# Patient Record
Sex: Female | Born: 2005 | Race: Black or African American | Hispanic: No | Marital: Single
Health system: Southern US, Community
[De-identification: ages and names within clinical notes are randomized; demographics above are authoritative.]

## PROBLEM LIST (undated history)

## (undated) HISTORY — PX: TONSILLECTOMY: SUR1361

---
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
## 2024-01-07 ENCOUNTER — Ambulatory Visit (HOSPITAL_COMMUNITY)
Admission: EM | Admit: 2024-01-07 | Discharge: 2024-01-08 | Disposition: A | Discharge status: Other Acute Inpatient Hospital

## 2024-01-07 ENCOUNTER — Encounter (HOSPITAL_COMMUNITY): Payer: Self-pay | Admitting: Emergency Medicine

## 2024-01-07 DIAGNOSIS — R45851 Suicidal ideations: Secondary | ICD-10-CM | POA: Diagnosis not present

## 2024-01-07 DIAGNOSIS — Z62810 Personal history of physical and sexual abuse in childhood: Secondary | ICD-10-CM | POA: Diagnosis not present

## 2024-01-07 DIAGNOSIS — M08031 Unspecified juvenile rheumatoid arthritis, right wrist: Secondary | ICD-10-CM | POA: Diagnosis not present

## 2024-01-07 DIAGNOSIS — F332 Major depressive disorder, recurrent severe without psychotic features: Secondary | ICD-10-CM | POA: Diagnosis not present

## 2024-01-07 DIAGNOSIS — Z79620 Long term (current) use of immunosuppressive biologic: Secondary | ICD-10-CM | POA: Insufficient documentation

## 2024-01-07 DIAGNOSIS — M08032 Unspecified juvenile rheumatoid arthritis, left wrist: Secondary | ICD-10-CM | POA: Insufficient documentation

## 2024-01-07 DIAGNOSIS — Z638 Other specified problems related to primary support group: Secondary | ICD-10-CM | POA: Insufficient documentation

## 2024-01-07 LAB — CBC WITH DIFFERENTIAL/PLATELET
Abs Immature Granulocytes: 0.01 K/uL (ref 0.00–0.07)
Basophils Absolute: 0 K/uL (ref 0.0–0.1)
Basophils Relative: 1 %
Eosinophils Absolute: 0.1 K/uL (ref 0.0–0.5)
Eosinophils Relative: 1 %
HCT: 39.4 % (ref 36.0–46.0)
Hemoglobin: 12.6 g/dL (ref 12.0–15.0)
Immature Granulocytes: 0 %
Lymphocytes Relative: 37 %
Lymphs Abs: 1.8 K/uL (ref 0.7–4.0)
MCH: 26.7 pg (ref 26.0–34.0)
MCHC: 32 g/dL (ref 30.0–36.0)
MCV: 83.5 fL (ref 80.0–100.0)
Monocytes Absolute: 0.4 K/uL (ref 0.1–1.0)
Monocytes Relative: 8 %
Neutro Abs: 2.6 K/uL (ref 1.7–7.7)
Neutrophils Relative %: 53 %
Platelets: 305 K/uL (ref 150–400)
RBC: 4.72 MIL/uL (ref 3.87–5.11)
RDW: 14.4 % (ref 11.5–15.5)
WBC: 4.9 K/uL (ref 4.0–10.5)
nRBC: 0 % (ref 0.0–0.2)

## 2024-01-07 LAB — LIPID PANEL
Cholesterol: 189 mg/dL (ref 0–200)
HDL: 60 mg/dL (ref >40)
LDL Cholesterol: 120 mg/dL — ABNORMAL HIGH (ref 0–99)
Total CHOL/HDL Ratio: 3.2 ratio
Triglycerides: 44 mg/dL (ref <150)
VLDL: 9 mg/dL (ref 0–40)

## 2024-01-07 LAB — HEMOGLOBIN A1C
Hgb A1c MFr Bld: 4.9 % (ref 4.8–5.6)
Mean Plasma Glucose: 93.93 mg/dL

## 2024-01-07 LAB — COMPREHENSIVE METABOLIC PANEL WITH GFR
ALT: 29 U/L (ref 0–44)
AST: 36 U/L (ref 15–41)
Albumin: 4.5 g/dL (ref 3.5–5.0)
Alkaline Phosphatase: 42 U/L (ref 38–126)
Anion gap: 15 (ref 5–15)
BUN: 7 mg/dL (ref 6–20)
CO2: 23 mmol/L (ref 22–32)
Calcium: 9.9 mg/dL (ref 8.9–10.3)
Chloride: 99 mmol/L (ref 98–111)
Creatinine, Ser: 0.78 mg/dL (ref 0.44–1.00)
GFR, Estimated: >60 mL/min (ref >60)
Glucose, Bld: 87 mg/dL (ref 70–99)
Potassium: 3.9 mmol/L (ref 3.5–5.1)
Sodium: 137 mmol/L (ref 135–145)
Total Bilirubin: 1.6 mg/dL — ABNORMAL HIGH (ref 0.0–1.2)
Total Protein: 7.5 g/dL (ref 6.5–8.1)

## 2024-01-07 LAB — ETHANOL: Alcohol, Ethyl (B): <15 mg/dL (ref <15)

## 2024-01-07 LAB — TSH: TSH: 1.045 u[IU]/mL (ref 0.350–4.500)

## 2024-01-07 MED ORDER — MAGNESIUM HYDROXIDE 400 MG/5ML PO SUSP: 30.0000 mL | Freq: Every day | ORAL | Status: DC | PRN

## 2024-01-07 MED ORDER — DIPHENHYDRAMINE HCL 50 MG/ML IJ SOLN: 50.0000 mg | Freq: Three times a day (TID) | INTRAMUSCULAR | Status: DC | PRN

## 2024-01-07 MED ORDER — ALUM & MAG HYDROXIDE-SIMETH 200-200-20 MG/5ML PO SUSP: 30.0000 mL | ORAL | Status: DC | PRN

## 2024-01-07 MED ORDER — HYDROXYZINE HCL 25 MG PO TABS: 25.0000 mg | ORAL_TABLET | Freq: Three times a day (TID) | ORAL | Status: DC | PRN

## 2024-01-07 MED ORDER — ACETAMINOPHEN 325 MG PO TABS: 650.0000 mg | ORAL_TABLET | Freq: Four times a day (QID) | ORAL | Status: DC | PRN

## 2024-01-07 NOTE — ED Notes (Signed)
 Pt sleeping at present, no distress noted. Respirations even & unlabored.  Monitoring for safety.
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
## 2024-01-07 NOTE — ED Notes (Signed)
 Pt sad, depressed, flat affect, quiet. Only nods to answer questions. Denies SI/ HI/AVH. Contracts for safety. No noted distress. Will continue to monitor for safety

## 2024-01-07 NOTE — ED Notes (Signed)
 Pt reports si, denies plan and intent, verbal contract for safety provided. Pt presents as depressed, soft spoken and guarded. Pt provided with sandwich as desired. Pt denies desire to harm others. Pt oriented to unit.

## 2024-01-08 ENCOUNTER — Other Ambulatory Visit: Payer: Self-pay

## 2024-01-08 ENCOUNTER — Encounter (HOSPITAL_COMMUNITY): Payer: Self-pay

## 2024-01-08 ENCOUNTER — Inpatient Hospital Stay (HOSPITAL_COMMUNITY)
Admission: AD | Admit: 2024-01-08 | Discharge: 2024-01-11 | DRG: 881 | Disposition: A | Discharge status: Home / Self Care | Source: Intra-hospital

## 2024-01-08 DIAGNOSIS — F329 Major depressive disorder, single episode, unspecified: Secondary | ICD-10-CM | POA: Diagnosis present

## 2024-01-08 DIAGNOSIS — F4322 Adjustment disorder with anxiety: Secondary | ICD-10-CM | POA: Diagnosis present

## 2024-01-08 DIAGNOSIS — Z62810 Personal history of physical and sexual abuse in childhood: Secondary | ICD-10-CM | POA: Diagnosis not present

## 2024-01-08 DIAGNOSIS — Z733 Stress, not elsewhere classified: Secondary | ICD-10-CM

## 2024-01-08 DIAGNOSIS — Z608 Other problems related to social environment: Secondary | ICD-10-CM | POA: Diagnosis present

## 2024-01-08 DIAGNOSIS — Z79631 Long term (current) use of antimetabolite agent: Secondary | ICD-10-CM

## 2024-01-08 DIAGNOSIS — Z560 Unemployment, unspecified: Secondary | ICD-10-CM

## 2024-01-08 DIAGNOSIS — Z79899 Other long term (current) drug therapy: Secondary | ICD-10-CM

## 2024-01-08 DIAGNOSIS — Z818 Family history of other mental and behavioral disorders: Secondary | ICD-10-CM | POA: Diagnosis not present

## 2024-01-08 DIAGNOSIS — R45851 Suicidal ideations: Secondary | ICD-10-CM | POA: Diagnosis present

## 2024-01-08 DIAGNOSIS — Z79620 Long term (current) use of immunosuppressive biologic: Secondary | ICD-10-CM | POA: Diagnosis not present

## 2024-01-08 DIAGNOSIS — M069 Rheumatoid arthritis, unspecified: Secondary | ICD-10-CM | POA: Diagnosis present

## 2024-01-08 LAB — POCT URINE DRUG SCREEN - MANUAL ENTRY (I-SCREEN)
POC Amphetamine UR: NOT DETECTED
POC Buprenorphine (BUP): NOT DETECTED
POC Cocaine UR: NOT DETECTED
POC Marijuana UR: NOT DETECTED
POC Methadone UR: NOT DETECTED
POC Methamphetamine UR: NOT DETECTED
POC Morphine: NOT DETECTED
POC Oxazepam (BZO): NOT DETECTED
POC Oxycodone UR: NOT DETECTED
POC Secobarbital (BAR): NOT DETECTED

## 2024-01-08 MED ORDER — DIPHENHYDRAMINE HCL 25 MG PO CAPS: 50.0000 mg | ORAL_CAPSULE | Freq: Three times a day (TID) | ORAL | Status: DC | PRN

## 2024-01-08 MED ORDER — DIPHENHYDRAMINE HCL 50 MG/ML IJ SOLN: 50.0000 mg | Freq: Three times a day (TID) | INTRAMUSCULAR | Status: DC | PRN

## 2024-01-08 MED ORDER — HALOPERIDOL 5 MG PO TABS: 5.0000 mg | ORAL_TABLET | Freq: Three times a day (TID) | ORAL | Status: DC | PRN

## 2024-01-08 MED ORDER — MAGNESIUM HYDROXIDE 400 MG/5ML PO SUSP: 30.0000 mL | Freq: Every day | ORAL | Status: DC | PRN

## 2024-01-08 MED ORDER — ALUM & MAG HYDROXIDE-SIMETH 200-200-20 MG/5ML PO SUSP: 30.0000 mL | ORAL | Status: DC | PRN

## 2024-01-08 MED ORDER — HYDROXYZINE HCL 25 MG PO TABS: 25.0000 mg | ORAL_TABLET | Freq: Three times a day (TID) | ORAL | Status: DC | PRN

## 2024-01-08 MED ORDER — HALOPERIDOL LACTATE 5 MG/ML IJ SOLN: 10.0000 mg | Freq: Three times a day (TID) | INTRAMUSCULAR | Status: DC | PRN

## 2024-01-08 MED ORDER — ACETAMINOPHEN 325 MG PO TABS: 650.0000 mg | ORAL_TABLET | Freq: Four times a day (QID) | ORAL | Status: DC | PRN

## 2024-01-08 MED ORDER — LORAZEPAM 2 MG/ML IJ SOLN: 2.0000 mg | Freq: Three times a day (TID) | INTRAMUSCULAR | Status: DC | PRN

## 2024-01-08 MED ORDER — ENSURE PLUS HIGH PROTEIN PO LIQD
237.0000 mL | Freq: Two times a day (BID) | ORAL | Status: DC
  Filled 2024-01-08 (×8): qty 237

## 2024-01-08 MED ORDER — HALOPERIDOL LACTATE 5 MG/ML IJ SOLN: 5.0000 mg | Freq: Three times a day (TID) | INTRAMUSCULAR | Status: DC | PRN

## 2024-01-08 MED ORDER — INFLUENZA VIRUS VACC SPLIT PF (FLUZONE) 0.5 ML IM SUSY
0.5000 mL | PREFILLED_SYRINGE | INTRAMUSCULAR | Status: DC
Start: 2024-01-09 — End: 2024-01-11
  Filled 2024-01-08: qty 0.5

## 2024-01-08 NOTE — ED Notes (Signed)
 Pt A&Ox4, calm & cooperative, nods appropriately and in NAD at this time. Denies SI/HI/AVH. Contracts for safety. Encouragement and support given. Will continue to monitor.

## 2024-01-08 NOTE — Tx Team (Signed)
 Initial Treatment Plan 01/08/2024 12:28 PM Christine Lucero FMW:[id number]    PATIENT STRESSORS: Loss of relationship     PATIENT STRENGTHS: Motivation for treatment/growth  Special hobby/interest  Supportive family/friends    PATIENT IDENTIFIED PROBLEMS: I went through a breakup                     DISCHARGE CRITERIA:  Improved stabilization in mood, thinking, and/or behavior  PRELIMINARY DISCHARGE PLAN: Outpatient therapy  PATIENT/FAMILY INVOLVEMENT: This treatment plan has been presented to and reviewed with the patient, Christine Lucero.  The patient has been given the opportunity to ask questions and make suggestions.  Annalee Larch, RN 01/08/2024, 12:28 PM

## 2024-01-08 NOTE — ED Notes (Signed)
 Pt sleeping at this time. Rise and fall of chest noted. Pt in NAD at this time. Will continue to monitor.

## 2024-01-08 NOTE — Group Note (Signed)
 Date:  01/08/2024 Time:  5:02 PM  Group Topic/Focus:  Emotional Education:   The focus of this group is to discuss what feelings/emotions are, and how they are experienced.    Participation Level:  Active  Participation Quality:  Appropriate  Affect:  Appropriate  Cognitive:  Appropriate  Insight: Good  Engagement in Group:  Engaged  Modes of Intervention:  Discussion   Christine Lucero 01/08/2024, 5:02 PM
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
## 2024-01-08 NOTE — Plan of Care (Signed)
  Problem: Education: Goal: Emotional status will improve Outcome: Progressing Goal: Verbalization of understanding the information provided will improve Outcome: Progressing   Problem: Activity: Goal: Interest or engagement in activities will improve Outcome: Progressing   Problem: Coping: Goal: Ability to demonstrate self-control will improve Outcome: Progressing

## 2024-01-08 NOTE — ED Notes (Signed)
 Pt in bed w/eyes closed resting quietly. Respirations equal and unlabored. No signs or symptoms of distress. Routine safety checks maintained/ongoing.

## 2024-01-08 NOTE — [facility] Group Notes (Signed)
 [facility] Group Notes:  (Nursing/MHT/Case Management/Adjunct)  Date:  01/08/2024  Time:  9:08 PM  Type of Therapy:  Psychoeducational Skills  Participation Level:  Active  Participation Quality:  Attentive  Affect:  Flat  Cognitive:  Appropriate  Insight:  Appropriate  Engagement in Group:  Developing/Improving  Modes of Intervention:  Education  Summary of Progress/Problems: Patient rated her day as a 7 out of a possible 10. She was active in playing basketball with her peers and states that her positive event for the day was talking to her peers.   Donyae Kohn S 01/08/2024, 9:08 PM

## 2024-01-08 NOTE — ED Notes (Addendum)
 Pt signed electronic consent.  [facility] notified. Urine cup at bedside, pending urine.

## 2024-01-08 NOTE — Progress Notes (Signed)
   01/08/24 2200  Psych Admission Type (Psych Patients Only)  Admission Status Voluntary  Psychosocial Assessment  Patient Complaints Depression  Eye Contact Fair  Facial Expression Flat;Sad  Affect Depressed  Speech Logical/coherent;Soft  Interaction Cautious  Motor Activity Slow  Appearance/Hygiene Unremarkable  Behavior Characteristics Cooperative;Appropriate to situation  Mood Depressed  Thought Process  Coherency WDL  Content WDL  Delusions None reported or observed  Perception WDL  Hallucination None reported or observed  Judgment WDL  Confusion None  Danger to Self  Current suicidal ideation? Denies  Agreement Not to Harm Self Yes  Description of Agreement Verbal  Danger to Others  Danger to Others None reported or observed

## 2024-01-08 NOTE — Progress Notes (Signed)
   01/08/24 1200  Psych Admission Type (Psych Patients Only)  Admission Status Voluntary  Psychosocial Assessment  Patient Complaints Depression  Eye Contact Fair  Facial Expression Blank;Flat;Sad  Affect Depressed  Speech Soft;Logical/coherent  Interaction Cautious;Minimal  Motor Activity Slow  Appearance/Hygiene Unremarkable  Behavior Characteristics Cooperative;Appropriate to situation  Mood Depressed;Sad  Thought Process  Coherency WDL  Content WDL  Delusions None reported or observed  Perception WDL  Hallucination None reported or observed  Judgment WDL  Confusion None  Danger to Self  Current suicidal ideation? Denies  Agreement Not to Harm Self Yes  Description of Agreement verbal  Danger to Others  Danger to Others None reported or observed

## 2024-01-08 NOTE — Discharge Instructions (Addendum)
To [facility]

## 2024-01-08 NOTE — ED Notes (Signed)
 Patient Vol transferred to [facility] via safe transport. Pt A&Ox4, ambulatory w/ steady gait and VSS upon departure. All belongings and paperwork given to safe transport.

## 2024-01-08 NOTE — Group Note (Signed)
 LCSW Group Therapy Note   Group Date: 01/08/2024 Start Time: 1100 End Time: 1200   Participation:  did not attend  Type of Therapy:  Group Therapy  Topic:  Healing From Within: Understanding Our Past, Building Our Future"  Objective:  To help participants understand the impact of early experiences on mental and physical health, with a focus on Adverse Childhood Experiences (ACEs), and to explore ways to build resilience and healing.  Goals: Understand ACEs and Their Impact: Learn how childhood experiences shape mental and physical health. Build Resilience: Develop strategies for overcoming challenges and creating positive change. Promote Healing: Recognize the value of support and the possibility of healing through therapy and self-care.  Summary:  In today's session, we discussed how early experiences, especially ACEs, impact mental and physical health. We explored the effects of stress, abuse, and neglect on brain development and well-being. The group focused on resilience, understanding that healing and positive change are possible with support and self-awareness.  Therapeutic Modalities Used: Psychoeducation: Sharing information about ACEs and their effects. Cognitive Behavioral Therapy (CBT): Helping reframe negative thought patterns. Trauma-Informed Therapy: Creating a safe, supportive space for healing.   Kellina Dreese O Amalio Loe, LCSWA 01/08/2024  12:24 PM
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
## 2024-01-09 ENCOUNTER — Encounter (HOSPITAL_COMMUNITY): Payer: Self-pay

## 2024-01-09 MED ORDER — METHOTREXATE SODIUM 2.5 MG PO TABS
10.0000 mg | ORAL_TABLET | ORAL | Status: DC
  Administered 2024-01-10: 10 mg via ORAL
  Filled 2024-01-09: qty 4

## 2024-01-09 MED ORDER — ADALIMUMAB 40 MG/0.4ML ~~LOC~~ AJKT
40.0000 mg | AUTO-INJECTOR | SUBCUTANEOUS | Status: DC
  Administered 2024-01-09: 40 mg via SUBCUTANEOUS

## 2024-01-09 MED ORDER — NON FORMULARY: 40.0000 mg | Status: DC

## 2024-01-09 MED ORDER — FOLIC ACID 1 MG PO TABS
1.0000 mg | ORAL_TABLET | Freq: Every day | ORAL | Status: DC
  Administered 2024-01-09 – 2024-01-11 (×3): 1 mg via ORAL
  Filled 2024-01-09 (×3): qty 1
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
## 2024-01-09 NOTE — Progress Notes (Signed)
 Spirituality group facilitated by Elia Rockie Sofia, [facility].  Group Description: Group focused on topic of hope. Patients participated in facilitated discussion around topic, connecting with one another around experiences and definitions for hope. Group members engaged with visual explorer photos, reflecting on what hope looks like for them today. Group engaged in discussion around how their definitions of hope are present today in hospital.  Modalities: Psycho-social ed, Adlerian, Narrative, MI  Patient Progress: Christine Lucero attended group and actively engaged and participated ing roup conversation and activities.  She finds hope in her mom and her teammates and coaches.

## 2024-01-09 NOTE — BH IP Treatment Plan (Signed)
 Interdisciplinary Treatment and Diagnostic Plan Update  01/09/2024 Time of Session: 10:35 AM Eryca Hightower MRN: [id number]  Principal Diagnosis: MDD (major depressive disorder)  Secondary Diagnoses: Principal Problem:   MDD (major depressive disorder)   Current Medications:  Current Facility-Administered Medications  Medication Dose Route Frequency Provider Last Rate Last Admin   acetaminophen  (TYLENOL ) tablet 650 mg  650 mg Oral Q6H PRN Miller-Almeida, Rock PARAS, NP       adalimumab  (HUMIRA ) pen 40 mg  40 mg Subcutaneous Q14 Days Bouchard, Marc A, DO       alum & mag hydroxide-simeth (MAALOX/MYLANTA) 200-200-20 MG/5ML suspension 30 mL  30 mL Oral Q4H PRN Delsie Lynwood Morene Lavone, MD       haloperidol  (HALDOL ) tablet 5 mg  5 mg Oral TID PRN Delsie Lynwood Morene Lavone, MD       And   diphenhydrAMINE  (BENADRYL ) capsule 50 mg  50 mg Oral TID PRN Delsie Lynwood Morene Lavone, MD       hydrOXYzine  (ATARAX ) tablet 25 mg  25 mg Oral TID PRN Miller-Almeida, Linda J, NP       Or   diphenhydrAMINE  (BENADRYL ) injection 50 mg  50 mg Intramuscular TID PRN Miller-Almeida, Linda J, NP       haloperidol  lactate (HALDOL ) injection 5 mg  5 mg Intramuscular TID PRN Delsie Lynwood Morene Lavone, MD       And   diphenhydrAMINE  (BENADRYL ) injection 50 mg  50 mg Intramuscular TID PRN Delsie Lynwood Morene Lavone, MD       And   LORazepam  (ATIVAN ) injection 2 mg  2 mg Intramuscular TID PRN Delsie Lynwood Morene Lavone, MD       haloperidol  lactate (HALDOL ) injection 10 mg  10 mg Intramuscular TID PRN Delsie Lynwood Morene Lavone, MD       And   diphenhydrAMINE  (BENADRYL ) injection 50 mg  50 mg Intramuscular TID PRN Delsie Lynwood Morene Lavone, MD       And   LORazepam  (ATIVAN ) injection 2 mg  2 mg Intramuscular TID PRN Delsie Lynwood Morene Lavone, MD       feeding supplement (ENSURE PLUS HIGH PROTEIN) liquid 237 mL  237 mL Oral BID BM Bouchard, Marc A, DO       folic acid   (FOLVITE ) tablet 1 mg  1 mg Oral Daily Bennett, Christal H, NP   1 mg at 01/09/24 1529   influenza vac split trivalent PF (FLUZONE ) injection 0.5 mL  0.5 mL Intramuscular Tomorrow-1000 Bouchard, Marc A, DO       magnesium  hydroxide (MILK OF MAGNESIA) suspension 30 mL  30 mL Oral Daily PRN Delsie Lynwood Morene Lavone, MD       [START ON 01/10/2024] methotrexate  (RHEUMATREX) tablet 10 mg  10 mg Oral Q Thu Bennett, Christal H, NP       PTA Medications: Medications Prior to Admission  Medication Sig Dispense Refill Last Dose/Taking   adalimumab  (HUMIRA , 2 PEN,) 40 MG/0.4ML pen Inject 40 mg into the skin every 14 (fourteen) days.      folic acid  (FOLVITE ) 1 MG tablet Take 1 mg by mouth daily.      methotrexate  (RHEUMATREX) 2.5 MG tablet Take 10 mg by mouth every Thursday.       Patient Stressors: Loss of relationship    Patient Strengths: Motivation for treatment/growth  Special hobby/interest  Supportive family/friends   Treatment Modalities: Medication Management, Group therapy, Case management,  1 to 1 session with clinician, Psychoeducation, Recreational therapy.   Physician Treatment  Plan for Primary Diagnosis: MDD (major depressive disorder) Long Term Goal(s): Improvement in symptoms so as ready for discharge   Short Term Goals: Ability to identify changes in lifestyle to reduce recurrence of condition will improve  Medication Management: Evaluate patient's response, side effects, and tolerance of medication regimen.  Therapeutic Interventions: 1 to 1 sessions, Unit Group sessions and Medication administration.  Evaluation of Outcomes: Not Progressing  Physician Treatment Plan for Secondary Diagnosis: Principal Problem:   MDD (major depressive disorder)  Long Term Goal(s): Improvement in symptoms so as ready for discharge   Short Term Goals: Ability to identify changes in lifestyle to reduce recurrence of condition will improve     Medication Management: Evaluate  patient's response, side effects, and tolerance of medication regimen.  Therapeutic Interventions: 1 to 1 sessions, Unit Group sessions and Medication administration.  Evaluation of Outcomes: Not Progressing   RN Treatment Plan for Primary Diagnosis: MDD (major depressive disorder) Long Term Goal(s): Knowledge of disease and therapeutic regimen to maintain health will improve  Short Term Goals: Ability to remain free from injury will improve, Ability to verbalize frustration and anger appropriately will improve, Ability to demonstrate self-control, Ability to participate in decision making will improve, Ability to verbalize feelings will improve, Ability to disclose and discuss suicidal ideas, Ability to identify and develop effective coping behaviors will improve, and Compliance with prescribed medications will improve  Medication Management: RN will administer medications as ordered by provider, will assess and evaluate patient's response and provide education to patient for prescribed medication. RN will report any adverse and/or side effects to prescribing provider.  Therapeutic Interventions: 1 on 1 counseling sessions, Psychoeducation, Medication administration, Evaluate responses to treatment, Monitor vital signs and CBGs as ordered, Perform/monitor CIWA, COWS, AIMS and Fall Risk screenings as ordered, Perform wound care treatments as ordered.  Evaluation of Outcomes: Not Progressing   LCSW Treatment Plan for Primary Diagnosis: MDD (major depressive disorder) Long Term Goal(s): Safe transition to appropriate next level of care at discharge, Engage patient in therapeutic group addressing interpersonal concerns.  Short Term Goals: Engage patient in aftercare planning with referrals and resources, Increase social support, Increase ability to appropriately verbalize feelings, Increase emotional regulation, Facilitate acceptance of mental health diagnosis and concerns, Facilitate patient  progression through stages of change regarding substance use diagnoses and concerns, Identify triggers associated with mental health/substance abuse issues, and Increase skills for wellness and recovery  Therapeutic Interventions: Assess for all discharge needs, 1 to 1 time with Social worker, Explore available resources and support systems, Assess for adequacy in community support network, Educate family and significant other(s) on suicide prevention, Complete Psychosocial Assessment, Interpersonal group therapy.  Evaluation of Outcomes: Not Progressing   Progress in Treatment: Attending groups: Yes. Participating in groups: Yes. Taking medication as prescribed: Yes. Toleration medication: Yes. Family/Significant other contact made: No, will contact:  Lela (mother) [phone] Patient understands diagnosis: Yes. Discussing patient identified problems/goals with staff: Yes. Medical problems stabilized or resolved: Yes. Denies suicidal/homicidal ideation: Yes. Issues/concerns per patient self-inventory: No.  New problem(s) identified:  No  New Short Term/Long Term Goal(s):  Patient recently admitted. CSW will continue to follow and assess for appropriate referrals and possible discharge planning.    Patient Goals:  I'm not sure.    Discharge Plan or Barriers:  Patient recently admitted. CSW will continue to follow and assess for appropriate referrals and possible discharge planning.     Reason for Continuation of Hospitalization: Depression Medication stabilization Suicidal ideation  Estimated Length  of Stay:  5 - 7 days  Last 3 [place] Suicide Severity Risk Score: Flowsheet Row Ad[facility] (Current) [facility] from 01/07/2024 in [facility] ED from 12/12/2020 in [facility] Emergency Department [facility]  C-SSRS RISK CATEGORY No Risk No Risk No Risk    Last [facility] 2/9 Scores:     01/07/2024    4:57 PM  Depression screen PHQ 2/9  Decreased Interest 1  Down, Depressed, Hopeless 1  PHQ - 2 Score 2  Altered sleeping 1  Tired, decreased energy 1  Change in appetite 3  Feeling bad or failure about yourself  2  Trouble concentrating 2  Moving slowly or fidgety/restless 0  Suicidal thoughts 1  PHQ-9 Score 12  Difficult doing work/chores Somewhat difficult    Scribe for Treatment Team: Dierra Riesgo O Maleeka Sabatino, LCSWA 01/09/2024 4:49 PM

## 2024-01-09 NOTE — Progress Notes (Signed)
   01/09/24 0900  Psych Admission Type (Psych Patients Only)  Admission Status Voluntary  Psychosocial Assessment  Patient Complaints Depression  Eye Contact Fair  Facial Expression Flat;Sad  Affect Depressed  Speech Logical/coherent;Soft  Interaction Cautious  Motor Activity Slow  Appearance/Hygiene Unremarkable  Behavior Characteristics Cooperative;Appropriate to situation  Mood Depressed  Thought Process  Coherency WDL  Content WDL  Delusions None reported or observed  Perception WDL  Hallucination None reported or observed  Judgment WDL  Confusion None  Danger to Self  Current suicidal ideation? Denies  Agreement Not to Harm Self Yes  Description of Agreement verbal  Danger to Others  Danger to Others None reported or observed

## 2024-01-09 NOTE — Group Note (Signed)
 Date:  01/09/2024 Time:  11:54 AM  Group Type: Grief Processing Group Modality: Psychoeducational Duration: 1100-1125 Participants: 11 S - Subjective:  Participants expressed a range of emotions related to grief, including sadness, confusion, anger, and moments of acceptance. Several group members shared relief in realizing that their emotional fluctuations are normal and valid. O - Objective: Introduced the Doctor, hospital to normalize the non-linear nature of grief A - Assessment:  Clients demonstrated increased insight into their grief experiences and emotional responses. Participation was active and reflective.  P - Plan: Reinforce normalization of grief variability and encourage ongoing peer sharing   Participation Level:  Active  Participation Quality:  Appropriate  Affect:  Appropriate  Cognitive:  Appropriate  Insight: Appropriate and Limited  Engagement in Group:  Engaged  Modes of Intervention:  Discussion  Additional Comments:  None   Armetta Henri N Vyom Brass 01/09/2024, 11:54 AM

## 2024-01-09 NOTE — BHH Group Notes (Signed)
 Adult Psychoeducational Group Note  Date:  01/09/2024 Time:  8:32 PM  Group Topic/Focus:  Wrap-Up Group:   The focus of this group is to help patients review their daily goal of treatment and discuss progress on daily workbooks.  Participation Level:  Active  Participation Quality:  Appropriate  Affect:  Appropriate  Cognitive:  Appropriate  Insight: Appropriate  Engagement in Group:  Engaged  Modes of Intervention:  Discussion  Additional Comments:  Christine Lucero Attend AA wrap up group.  Lang Drilling Long 01/09/2024, 8:32 PM

## 2024-01-09 NOTE — Group Note (Signed)
 Date:  01/09/2024 Time:  9:40 AM  Group Topic/Focus:  Goals Group:   The focus of this group is to help patients establish daily goals to achieve during treatment and discuss how the patient can incorporate goal setting into their daily lives to aide in recovery.    Participation Level:  Active  Participation Quality:  Appropriate  Affect:  Appropriate  Cognitive:  Appropriate  Insight: Appropriate  Engagement in Group:  Engaged  Modes of Intervention:  Discussion  Additional Comments:  take part in more groups,stay positive,and get upset  Nat Rummer 01/09/2024, 9:40 AM

## 2024-01-09 NOTE — Progress Notes (Signed)
 Methotrexate  (Trexall ; Rheumatrex) hold criteria Hgb < 8 WBC < 3 Pltc < 100K SCr > 1.5x baseline (or > 2 if baseline unknown) AST or ALT >3x ULN Bili > 1.5x ULN Ascites or pleural effusion Diarrhea - Grade 2 or higher Ulcerative stomatitis Unexplained pneumonitis / hypoxemia Active infection   Hold criteria NOT met. Thanks! Landy Almarie SAUNDERS 01/09/2024 3:04 PM

## 2024-01-09 NOTE — Group Note (Signed)
 Date:  01/09/2024 Time:  6:16 PM  Group Topic/Focus:  Developing a Wellness Toolbox:   The focus of this group is to help patients develop a wellness toolbox with skills and strategies to promote recovery upon discharge.    Participation Level:  Active  Participation Quality:  Appropriate  Affect:  Appropriate  Cognitive:  Appropriate  Insight: Appropriate  Engagement in Group:  Engaged  Modes of Intervention:  Discussion   Annalee  Lynzee Lindquist 01/09/2024, 6:16 PM

## 2024-01-09 NOTE — Plan of Care (Signed)
  Problem: Education: Goal: Emotional status will improve Outcome: Progressing Goal: Verbalization of understanding the information provided will improve Outcome: Progressing   Problem: Activity: Goal: Sleeping patterns will improve Outcome: Progressing   Problem: Coping: Goal: Ability to demonstrate self-control will improve Outcome: Progressing

## 2024-01-09 NOTE — BHH Counselor (Signed)
 CSW attempted to complete PSA, however patient was engaged in group at the time. CSW to attempt later.  Jayliana Valencia, LCSWA 01/09/24 10:00am

## 2024-01-09 NOTE — Plan of Care (Signed)
  Problem: Education: Goal: Emotional status will improve Outcome: Progressing Goal: Mental status will improve Outcome: Progressing   Problem: Activity: Goal: Interest or engagement in activities will improve Outcome: Not Progressing   

## 2024-01-09 NOTE — Group Note (Unsigned)
 Date:  01/09/2024 Time:  9:50 AM  Group Topic/Focus:  Goals Group:   The focus of this group is to help patients establish daily goals to achieve during treatment and discuss how the patient can incorporate goal setting into their daily lives to aide in recovery.     Participation Level:  {BHH PARTICIPATION OZCZO:77735}  Participation Quality:  {B[facility] PARTICIPATION QUALITY:22265}  Affect:  {BHH AFFECT:22266}  Cognitive:  {BHH COGNITIVE:22267}  Insight: {BHH Insight2:20797}  Engagement in Group:  {BHH ENGAGEMENT IN HMNLE:77731}  Modes of Intervention:  {BHH MODES OF INTERVENTION:22269}  Additional Comments:  ***  Nat Rummer 01/09/2024, 9:50 AM
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
## 2024-01-09 NOTE — Progress Notes (Signed)
(  Sleep Hours) - 6.5  (Any PRNs that were needed, meds refused, or side effects to meds)- n/a  (Any disturbances and when (visitation, over night)- n/a  (Concerns raised by the patient)- n/a  (SI/HI/AVH)- denies

## 2024-01-09 NOTE — Group Note (Signed)
 Recreation Therapy Group Note   Group Topic:Leisure Education  Group Date: 01/09/2024 Start Time: 0932 End Time: 1000 Facilitators: Mazi Brailsford-McCall, LRT,CTRS Location: [address] Dayroom   Group Topic: Leisure Education  Goal Area(s) Addresses:  Patient will identify positive leisure activities for use post discharge. Patient will identify at least one positive benefit of participation in leisure activities.  Patient will work effectively work with peers to keep the ball in play.  Behavioral Response: Engaged   Intervention: [employer], Music   Activity: Patients were to sit in a circle. Patients would toss a beach ball to each other keeping the ball in motion. LRT would time the group to see how long they could keep the ball moving. Patients could bounce or roll the ball but it could not come to a stop. If the ball were to stop moving, LRT would start the timer over.  Education:  Leisure Scientist, physiological, Special educational needs teacher, Teamwork, Discharge Planning  Education Outcome: Acknowledges education/In group clarification offered/Needs additional education.    Affect/Mood: Appropriate   Participation Level: Engaged   Participation Quality: Independent   Behavior: Appropriate   Speech/Thought Process: Barely audible    Insight: Good   Judgement: Good   Modes of Intervention: Cooperative Play   Patient Response to Interventions:  Engaged   Education Outcome:  In group clarification offered    Clinical Observations/Individualized Feedback: Pt was engaged but soft spoken. Pt appeared to have a good time and was focused on keeping the ball in play.     Plan: Continue to engage patient in RT group sessions 2-3x/week.   Robt Okuda-McCall, LRT,CTRS 01/09/2024 12:29 PM

## 2024-01-10 NOTE — [facility] Suicide Risk Assessment (Signed)
 [facility] INPATIENT:  Family/Significant Other Suicide Prevention Education  Suicide Prevention Education:  Education Completed; Lela (mother) [phone],  (name of family member/significant other) has been identified by the patient as the family member/significant other with whom the patient will be residing, and identified as the person(s) who will aid the patient in the event of a mental health crisis (suicidal ideations/suicide attempt).  With written consent from the patient, the family member/significant other has been provided the following suicide prevention education, prior to the and/or following the discharge of the patient.  Lela (mother) confirmed patient will not have access to firearms/guns/weapons to harm herself or others. Lela reported she'd be willing to assist patient should she have a mental health crisis and would be willing to assist patient with safely storing and securing medications should she need assistance. Lela denied having any safety concerns related to patient discharging home. Patient's mother reported she'll be picking patient up at 11:30am on 01/11/24.   The suicide prevention education provided includes the following: Suicide risk factors Suicide prevention and interventions National Suicide Hotline telephone number [facility] assessment telephone number [facility] Emergency Assistance 911 [facility] and/or Residential Mobile Crisis Unit telephone number  Request made of family/significant other to: Remove weapons (e.g., guns, rifles, knives), all items previously/currently identified as safety concern.   Remove drugs/medications (over-the-counter, prescriptions, illicit drugs), all items previously/currently identified as a safety concern.  The family member/significant other verbalizes understanding of the suicide prevention education information provided.  The family member/significant other agrees to remove the items of safety  concern listed above.  Connelly Spruell M Tristy Udovich, LCSWA 01/10/2024, 3:23 PM

## 2024-01-10 NOTE — Progress Notes (Signed)
 [facility] Inpatient Psychiatry Progress Note  Date: 01/10/24 Patient: Christine Lucero MRN: [id number]  Assessment and Plan: Christine Lucero is a 18 y.o. female admitted for depression and passive suicidal thoughts in the context of a breakup.   Since admission she has improved considerably and is requesting discharge tomorrow.   # MDD (major depressive disorder) - Improved - No medications    Risk Assessment - Low  Discharge Planning Estimated length of stay: DC tomorrow Predicted Discharge location: Home     Interval History and update: Chart reviewed. No significant interval events. Patient has been participating in groups and has been visible in the milieu. She is not prescribed any psychotropics. She slept well. She reports an improved and euthymic mood today. She denies SI. She reports that the admission has been helpful as a sort of respite and break. She feels comfortable returning home tomorrow with plans to restart outpatient therapy.       Physical Exam MSK/Neuro - Normal gait and station Mental Status Exam Appearance - Casually dressed, good hygiene and grooming Attitude - reserved, polite Speech - normal volume, prosody, inflection Mood - Fine Affect - Mildly restricted Thought Process - LLGD Thought Content - No delusional TC expressed SI/HI - Denies Perceptions - Denies; not RIS Judgement/Insight - Good Fund of knowledge - WNL Language - No impairments      Lab Results:  No visits with results within 1 Day(s) from this visit.  Latest known visit with results is:  Admission on 01/07/2024, Discharged on 01/08/2024  Component Date Value Ref Range Status   Sodium 01/07/2024 137  135 - 145 mmol/L Final   Potassium 01/07/2024 3.9  3.5 - 5.1 mmol/L Final   Chloride 01/07/2024 99  98 - 111 mmol/L Final   CO2 01/07/2024 23  22 - 32 mmol/L Final   Glucose, Bld 01/07/2024 87  70 - 99 mg/dL Final   BUN 90/84/7974 7  6 - 20 mg/dL Final    Creatinine, Ser 01/07/2024 0.78  0.44 - 1.00 mg/dL Final   Calcium 90/84/7974 9.9  8.9 - 10.3 mg/dL Final   Total Protein 90/84/7974 7.5  6.5 - 8.1 g/dL Final   Albumin 90/84/7974 4.5  3.5 - 5.0 g/dL Final   AST 90/84/7974 36  15 - 41 U/L Final   ALT 01/07/2024 29  0 - 44 U/L Final   Alkaline Phosphatase 01/07/2024 42  38 - 126 U/L Final   Total Bilirubin 01/07/2024 1.6 (H)  0.0 - 1.2 mg/dL Final   GFR, Estimated 01/07/2024 >60  >60 mL/min Final   Anion gap 01/07/2024 15  5 - 15 Final   WBC 01/07/2024 4.9  4.0 - 10.5 K/uL Final   RBC 01/07/2024 4.72  3.87 - 5.11 MIL/uL Final   Hemoglobin 01/07/2024 12.6  12.0 - 15.0 g/dL Final   HCT 90/84/7974 39.4  36.0 - 46.0 % Final   MCV 01/07/2024 83.5  80.0 - 100.0 fL Final   MCH 01/07/2024 26.7  26.0 - 34.0 pg Final   MCHC 01/07/2024 32.0  30.0 - 36.0 g/dL Final   RDW 90/84/7974 14.4  11.5 - 15.5 % Final   Platelets 01/07/2024 305  150 - 400 K/uL Final   nRBC 01/07/2024 0.0  0.0 - 0.2 % Final   Neutrophils Relative % 01/07/2024 53  % Final   Neutro Abs 01/07/2024 2.6  1.7 - 7.7 K/uL Final   Lymphocytes Relative 01/07/2024 37  % Final   Lymphs Abs 01/07/2024 1.8  0.7 - 4.0 K/uL Final   Monocytes Relative 01/07/2024 8  % Final   Monocytes Absolute 01/07/2024 0.4  0.1 - 1.0 K/uL Final   Eosinophils Relative 01/07/2024 1  % Final   Eosinophils Absolute 01/07/2024 0.1  0.0 - 0.5 K/uL Final   Basophils Relative 01/07/2024 1  % Final   Basophils Absolute 01/07/2024 0.0  0.0 - 0.1 K/uL Final   Immature Granulocytes 01/07/2024 0  % Final   Abs Immature Granulocytes 01/07/2024 0.01  0.00 - 0.07 K/uL Final   Hgb A1c MFr Bld 01/07/2024 4.9  4.8 - 5.6 % Final   Mean Plasma Glucose 01/07/2024 93.93  mg/dL Final   TSH 90/84/7974 1.045  0.350 - 4.500 uIU/mL Final   Alcohol, Ethyl (B) 01/07/2024 <15  <15 mg/dL Final   Cholesterol 90/84/7974 189  0 - 200 mg/dL Final   Triglycerides 90/84/7974 44  <150 mg/dL Final   HDL 90/84/7974 60  >40 mg/dL Final    Total CHOL/HDL Ratio 01/07/2024 3.2  RATIO Final   VLDL 01/07/2024 9  0 - 40 mg/dL Final   LDL Cholesterol 01/07/2024 120 (H)  0 - 99 mg/dL Final   POC Amphetamine UR 01/08/2024 None Detected  NONE DETECTED (Cut Off Level 1000 ng/mL) Corrected   POC Secobarbital (BAR) 01/08/2024 None Detected  NONE DETECTED (Cut Off Level 300 ng/mL) Corrected   POC Buprenorphine (BUP) 01/08/2024 None Detected  NONE DETECTED (Cut Off Level 10 ng/mL) Corrected   POC Oxazepam (BZO) 01/08/2024 None Detected  NONE DETECTED (Cut Off Level 300 ng/mL) Corrected   POC Cocaine UR 01/08/2024 None Detected  NONE DETECTED (Cut Off Level 300 ng/mL) Corrected   POC Methamphetamine UR 01/08/2024 None Detected  NONE DETECTED (Cut Off Level 1000 ng/mL) Corrected   POC Morphine 01/08/2024 None Detected  NONE DETECTED (Cut Off Level 300 ng/mL) Corrected   POC Methadone UR 01/08/2024 None Detected  NONE DETECTED (Cut Off Level 300 ng/mL) Corrected   POC Oxycodone UR 01/08/2024 None Detected  NONE DETECTED (Cut Off Level 100 ng/mL) Corrected   POC Marijuana UR 01/08/2024 None Detected  NONE DETECTED (Cut Off Level 50 ng/mL) Corrected     Vitals: Blood pressure 107/70, pulse 68, temperature 98.4 F (36.9 C), temperature source Oral, resp. rate 16, height 5' 1 (1.549 m), weight 52.7 kg, SpO2 100%.    Oliva DELENA Salmon, DO

## 2024-01-10 NOTE — Group Note (Deleted)
 Date:  01/10/2024 Time:  8:41 PM  Group Topic/Focus:  Wrap-Up Group:   The focus of this group is to help patients review their daily goal of treatment and discuss progress on daily workbooks.     Participation Level:  {BHH PARTICIPATION OZCZO:77735}  Participation Quality:  {B[facility] PARTICIPATION QUALITY:22265}  Affect:  {BHH AFFECT:22266}  Cognitive:  {B[facility] COGNITIVE:22267}  Insight: {BHH Insight2:20797}  Engagement in Group:  {BHH ENGAGEMENT IN HMNLE:77731}  Modes of Intervention:  {BHH MODES OF INTERVENTION:22269}  Additional Comments:  ***  Christine Lucero 01/10/2024, 8:41 PM

## 2024-01-10 NOTE — Group Note (Signed)
 LCSW Group Therapy Note   Group Date: 01/10/2024 Start Time: 1100 End Time: 1200   Participation:  patient was present.  He listened and was respectful but didn't participate in the discussion.    Type of Therapy:  Group Therapy  Topic:  Money Matters: Creating Stability, Confidence and Peace of Mind  Objective: To help participants understand the impact of financial stability on well-being through the lens of Maslow's Hierarchy of Needs and develop practical strategies for budgeting, saving, and debt repayment.  Goals: Increase awareness of spending habits and financial priorities, recognizing how money supports basic needs, security, and relationships. Develop simple budgeting and saving strategies to enhance stability and peace of mind.  Reduce financial stress by creating a realistic debt repayment plan, supporting long-term confidence and well-being.  Summary:  Participants explored how financial stability connects to basic needs, relationships, and self-esteem using Maslow's Hierarchy. They discussed budgeting, saving, and debt repayment strategies, identifying small, manageable changes. Through interactive discussion and self-reflection, they gained insight into their financial habits and created personal action steps for improvement.  Therapeutic Modalities Used: Elements of Cognitive Behavioral Therapy (CBT) - Addressing financial stress and thought patterns. Psychoeducation - Engineer, agricultural. Elements of Motivational Interviewing (MI) - Encouraging realistic, achievable changes. Group Support - Reducing shame and stress through shared experiences.   Werner Labella O Braylynn Lewing, LCSWA 01/10/2024  4:48 PM

## 2024-01-10 NOTE — Progress Notes (Signed)
   01/09/24 2300  Psych Admission Type (Psych Patients Only)  Admission Status Voluntary  Psychosocial Assessment  Patient Complaints Depression  Eye Contact Fair  Facial Expression Flat  Affect Depressed  Speech Logical/coherent;Soft  Interaction Cautious;Childlike  Motor Activity Slow  Appearance/Hygiene Unremarkable  Behavior Characteristics Cooperative;Appropriate to situation  Mood Depressed;Pleasant  Thought Process  Coherency WDL  Content WDL  Delusions None reported or observed  Perception WDL  Hallucination None reported or observed  Judgment WDL  Confusion None  Danger to Self  Current suicidal ideation? Denies  Agreement Not to Harm Self Yes  Description of Agreement Verbal  Danger to Others  Danger to Others None reported or observed

## 2024-01-10 NOTE — Plan of Care (Signed)
   Problem: Education: Goal: Knowledge of [facility] Education information/materials will improve Outcome: Progressing Goal: Emotional status will improve Outcome: Progressing Goal: Mental status will improve Outcome: Progressing   Problem: Activity: Goal: Interest or engagement in activities will improve Outcome: Progressing

## 2024-01-10 NOTE — [facility] Group Notes (Signed)
 The focus of this group is to help patients establish daily goals to achieve during treatment and discuss how the patient can incorporate goal setting into their daily lives to aide in recovery.         Scale 1-10: 10      Goal for the day: Figure out a discharge plan

## 2024-01-10 NOTE — Group Note (Signed)
 Occupational Therapy Group Note  Group Topic:Stress Management  Group Date: 01/10/2024 Start Time: 1500 End Time: 1536 Facilitators: Dot [place] MATSU, OT   Group Description: Group encouraged increased participation and engagement through discussion focused on topic of stress management. Patients engaged interactively to discuss components of stress including physical signs, emotional signs, negative management strategies, and positive management strategies. Each individual identified one new stress management strategy they would like to try moving forward.    Therapeutic Goals: Identify current stressors Identify healthy vs unhealthy stress management strategies/techniques Discuss and identify physical and emotional signs of stress   Participation Level: Engaged   Participation Quality: Independent   Behavior: Appropriate   Speech/Thought Process: Relevant   Affect/Mood: Appropriate   Insight: Fair   Judgement: Fair      Modes of Intervention: Education  Patient Response to Interventions:  Attentive   Plan: Continue to engage patient in OT groups 2 - 3x/week.  01/10/2024  [place] MATSU Dot, OT   Haillee Johann, OT

## 2024-01-10 NOTE — Group Note (Deleted)
 Date:  01/10/2024 Time:  8:30 PM  Group Topic/Focus:  Wrap-Up Group:   The focus of this group is to help patients review their daily goal of treatment and discuss progress on daily workbooks.     Participation Level:  {B[facility] PARTICIPATION OZCZO:77735}  Participation Quality:  {B[facility] PARTICIPATION QUALITY:22265}  Affect:  {BHH AFFECT:22266}  Cognitive:  {B[facility] COGNITIVE:22267}  Insight: {BHH Insight2:20797}  Engagement in Group:  {B[facility] ENGAGEMENT IN HMNLE:77731}  Modes of Intervention:  {B[facility] MODES OF INTERVENTION:22269}  Additional Comments:  ***  Christine Lucero 01/10/2024, 8:30 PM

## 2024-01-10 NOTE — Group Note (Deleted)
 Date:  01/10/2024 Time:  9:14 PM  Group Topic/Focus:  Wrap-Up Group:   The focus of this group is to help patients review their daily goal of treatment and discuss progress on daily workbooks.     Participation Level:  {B[facility] PARTICIPATION OZCZO:77735}  Participation Quality:  {BHH PARTICIPATION QUALITY:22265}  Affect:  {BHH AFFECT:22266}  Cognitive:  {BHH COGNITIVE:22267}  Insight: {BHH Insight2:20797}  Engagement in Group:  {B[facility] ENGAGEMENT IN HMNLE:77731}  Modes of Intervention:  {B[facility] MODES OF INTERVENTION:22269}  Additional Comments:  ***  Christine Lucero 01/10/2024, 9:14 PM

## 2024-01-10 NOTE — Group Note (Signed)
 Recreation Therapy Group Note   Group Topic:Other  Group Date: 01/10/2024 Start Time: 1405 End Time: 1450 Facilitators: Heran Campau-McCall, LRT,CTRS Location: [address] Dayroom   Activity Description/Intervention: Therapeutic Drumming. Patients with peers and staff were given the opportunity to engage in a leader facilitated [facility] Group Empowerment Drumming Circle with staff from the [employer], in partnership with [employer]. Teaching laboratory technician and trained [employer], Norleen Mon leading with LRT observing and documenting intervention and pt response. This evidenced-based practice targets 7 areas of health and wellbeing in the human experience including: stress-reduction, exercise, self-expression, camaraderie/support, nurturing, spirituality, and music-making (leisure).   Goal Area(s) Addresses:  Patient will engage in pro-social way in music group.  Patient will follow directions of drum leader on the first prompt. Patient will demonstrate no behavioral issues during group.  Patient will identify if a reduction in stress level occurs as a result of participation in therapeutic drum circle.    Education: Leisure exposure, Pharmacologist, Musical expression, Discharge Planning   Affect/Mood: Appropriate   Participation Level: Engaged   Participation Quality: Independent   Behavior: Appropriate   Speech/Thought Process: Focused   Insight: Good   Judgement: Good   Modes of Intervention: Teaching laboratory technician   Patient Response to Interventions:  Engaged   Education Outcome:  In group clarification offered    Clinical Observations/Individualized Feedback: Christine Lucero actively engaged in therapeutic drumming exercise and discussions. Pt was appropriate with peers, staff, and musical equipment for duration of programming.  Pt identified great as their feeling after participation in music-based programming. Pt affect congruent with verbalized  emotion.      Plan: Continue to engage patient in RT group sessions 2-3x/week.   [name], LRT,CTRS 01/10/2024 2:33 PM

## 2024-01-10 NOTE — Group Note (Signed)
 Date:  01/10/2024 Time:  9:56 PM  Group Topic/Focus:  Wrap-Up Group:   The focus of this group is to help patients review their daily goal of treatment and discuss progress on daily workbooks.    Participation Level:  Active  Participation Quality:  Appropriate  Affect:  Appropriate  Cognitive:  Appropriate  Insight: Appropriate  Engagement in Group:  Engaged  Modes of Intervention:  Education  Additional Comments:  Patient attended and participated in group tonight.  She reports that her goal was to attend her groups and to make discharge plans. She did.  Gwenn Chillington Dacosta 01/10/2024, 9:56 PM

## 2024-01-10 NOTE — Group Note (Deleted)
 Date:  01/10/2024 Time:  8:27 PM  Group Topic/Focus:  Wrap-Up Group:   The focus of this group is to help patients review their daily goal of treatment and discuss progress on daily workbooks.     Participation Level:  {B[facility] PARTICIPATION OZCZO:77735}  Participation Quality:  {B[facility] PARTICIPATION QUALITY:22265}  Affect:  {BHH AFFECT:22266}  Cognitive:  {B[facility] COGNITIVE:22267}  Insight: {BHH Insight2:20797}  Engagement in Group:  {B[facility] ENGAGEMENT IN HMNLE:77731}  Modes of Intervention:  {B[facility] MODES OF INTERVENTION:22269}  Additional Comments:  ***  Kada Friesen Dacosta 01/10/2024, 8:27 PM

## 2024-01-10 NOTE — Group Note (Deleted)
 Date:  01/10/2024 Time:  8:55 PM  Group Topic/Focus:  Wrap-Up Group:   The focus of this group is to help patients review their daily goal of treatment and discuss progress on daily workbooks.     Participation Level:  {BHH PARTICIPATION OZCZO:77735}  Participation Quality:  {BHH PARTICIPATION QUALITY:22265}  Affect:  {BHH AFFECT:22266}  Cognitive:  {BHH COGNITIVE:22267}  Insight: {BHH Insight2:20797}  Engagement in Group:  {B[facility] ENGAGEMENT IN HMNLE:77731}  Modes of Intervention:  {BHH MODES OF INTERVENTION:22269}  Additional Comments:  ***  Sharyah Bostwick Dacosta 01/10/2024, 8:55 PM

## 2024-01-10 NOTE — Progress Notes (Signed)
   01/10/24 0900  Psych Admission Type (Psych Patients Only)  Admission Status Voluntary  Psychosocial Assessment  Patient Complaints Depression  Eye Contact Fair  Facial Expression Flat  Affect Depressed  Speech Logical/coherent;Soft  Interaction Cautious  Motor Activity Other (Comment) (WNL)  Appearance/Hygiene Unremarkable  Behavior Characteristics Cooperative;Appropriate to situation  Mood Pleasant;Depressed  Thought Process  Coherency WDL  Content WDL  Delusions None reported or observed  Perception WDL  Hallucination None reported or observed  Judgment WDL  Confusion None  Danger to Self  Current suicidal ideation? Denies  Agreement Not to Harm Self Yes  Description of Agreement verbal  Danger to Others  Danger to Others None reported or observed

## 2024-01-10 NOTE — Plan of Care (Signed)
  Problem: Education: Goal: Emotional status will improve Outcome: Progressing Goal: Verbalization of understanding the information provided will improve Outcome: Progressing   Problem: Activity: Goal: Interest or engagement in activities will improve Outcome: Progressing Goal: Sleeping patterns will improve Outcome: Progressing

## 2024-01-11 DIAGNOSIS — F329 Major depressive disorder, single episode, unspecified: Principal | ICD-10-CM

## 2024-01-11 NOTE — [facility] Group Notes (Signed)
 Adult Psychoeducational Group Note  Date:  01/11/2024 Time:  10:50 AM  Group Topic/Focus:  Goals Group:   The focus of this group is to help patients establish daily goals to achieve during treatment and discuss how the patient can incorporate goal setting into their daily lives to aide in recovery.  Participation Level:  Active  Participation Quality:  Appropriate and Attentive  Affect:  Appropriate  Cognitive:  Appropriate  Insight: Appropriate  Engagement in Group:  Engaged  Modes of Intervention:  Exploration  Additional Comments:  Pt participated in Goals group. Pt stated her goal is to go to groups and participate.   Kyion Gautier 01/11/2024, 10:50 AM

## 2024-01-11 NOTE — Plan of Care (Signed)
 Nurse discussed anxiety, depression and coping systems with patient.
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
## 2024-01-11 NOTE — Progress Notes (Addendum)
(  Sleep Hours) - 8 hours (Any PRNs that were needed, meds refused, or side effects to meds)- none (Any disturbances and when (visitation, over night) (Concerns raised by the patient)- none  (SI/HI/AVH)- denies

## 2024-01-11 NOTE — Plan of Care (Signed)

## 2024-01-11 NOTE — Progress Notes (Signed)
 Patient discharged home with mother.  Patient denied SI and HI.  Denied A/V hallucinations.  Denied pain.  Medications administered per MD orders.  Safety maintained with 15 minute checks.  Emotional support and encouragement given patient.

## 2024-01-11 NOTE — Progress Notes (Signed)
  [facility] Case Management Discharge Plan :  Will you be returning to the same living situation after discharge:  Yes,  pt returning home after discharge.  At discharge, do you have transportation home?: Yes,  patient's mother to provide transportation at discharge.  Do you have the ability to pay for your medications: Yes,  pt has financial support from family.  Release of information consent forms completed and in the chart;  Patient's signature needed at discharge.  Patient to Follow up at:  Follow-up Information     [place] Go on 01/15/2024.   Specialty: Professional Counselor Why: Please go to this provider on 01/15/24 at 9:00 am for an assessment, to obtain therapy services. You may also go Monday through Friday, from 9 am to 1 pm. Contact information: [address] [place]  [address] [place] [address] [phone]                 Next level of care provider has access to [facility] Link:no  Safety Planning and Suicide Prevention discussed: Yes,  completed with  Lela (mother) [phone]      Has patient been referred to the Quitline?: Patient does not use tobacco/nicotine products  Patient has been referred for addiction treatment: No known substance use disorder.  Callin Ashe M Nusaiba Guallpa, LCSWA 01/11/2024, 8:50 AM
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
## 2024-01-11 NOTE — Plan of Care (Addendum)
 Patient denied SI and HI, contracts for safety.  Denied A/V hallucinations.  Denied pain.  Medications administrated per MD orders.  Emotional support and encouragement given patient. Safety maintained with 15 minute checks.
